# Patient Record
Sex: Male | Born: 1965 | Race: White | Hispanic: No | Smoking: Former smoker
Health system: Southern US, Community
[De-identification: ages and names within clinical notes are randomized; demographics above are authoritative.]

## PROBLEM LIST (undated history)

## (undated) HISTORY — PX: ANKLE SURGERY: SHX546

## (undated) HISTORY — PX: SKIN GRAFT: SHX250

---

## 2001-07-04 ENCOUNTER — Encounter: Payer: Self-pay | Admitting: Emergency Medicine

## 2001-07-04 ENCOUNTER — Emergency Department (HOSPITAL_COMMUNITY): Admission: EM | Admit: 2001-07-04 | Discharge: 2001-07-04 | Payer: Self-pay | Admitting: Emergency Medicine

## 2011-02-25 ENCOUNTER — Other Ambulatory Visit (HOSPITAL_COMMUNITY): Payer: Self-pay | Admitting: Family Medicine

## 2011-02-25 ENCOUNTER — Ambulatory Visit (HOSPITAL_COMMUNITY)
Admission: RE | Admit: 2011-02-25 | Discharge: 2011-02-25 | Disposition: A | Discharge status: Home / Self Care | Payer: Medicaid Other | Source: Ambulatory Visit | Attending: Family Medicine | Admitting: Family Medicine

## 2011-02-25 DIAGNOSIS — M1990 Unspecified osteoarthritis, unspecified site: Secondary | ICD-10-CM

## 2011-02-25 DIAGNOSIS — M79673 Pain in unspecified foot: Secondary | ICD-10-CM

## 2011-02-25 DIAGNOSIS — M25569 Pain in unspecified knee: Secondary | ICD-10-CM | POA: Insufficient documentation

## 2011-02-25 DIAGNOSIS — M25579 Pain in unspecified ankle and joints of unspecified foot: Secondary | ICD-10-CM | POA: Insufficient documentation

## 2011-04-25 ENCOUNTER — Ambulatory Visit (HOSPITAL_COMMUNITY)
Admission: RE | Admit: 2011-04-25 | Discharge: 2011-04-25 | Disposition: A | Discharge status: Home / Self Care | Payer: Medicaid Other | Source: Ambulatory Visit | Attending: Family Medicine | Admitting: Family Medicine

## 2011-04-25 ENCOUNTER — Other Ambulatory Visit (HOSPITAL_COMMUNITY): Payer: Self-pay | Admitting: Family Medicine

## 2011-04-25 DIAGNOSIS — R0609 Other forms of dyspnea: Secondary | ICD-10-CM | POA: Insufficient documentation

## 2011-04-25 DIAGNOSIS — R0602 Shortness of breath: Secondary | ICD-10-CM

## 2011-04-25 DIAGNOSIS — R0989 Other specified symptoms and signs involving the circulatory and respiratory systems: Secondary | ICD-10-CM | POA: Insufficient documentation

## 2012-04-03 ENCOUNTER — Other Ambulatory Visit (HOSPITAL_COMMUNITY): Payer: Self-pay | Admitting: Family Medicine

## 2012-04-03 DIAGNOSIS — M545 Low back pain: Secondary | ICD-10-CM

## 2012-04-13 ENCOUNTER — Ambulatory Visit (HOSPITAL_COMMUNITY): Admission: RE | Admit: 2012-04-13 | Payer: Medicaid Other | Source: Ambulatory Visit

## 2012-04-17 ENCOUNTER — Ambulatory Visit (HOSPITAL_COMMUNITY)
Admission: RE | Admit: 2012-04-17 | Discharge: 2012-04-17 | Disposition: A | Discharge status: Home / Self Care | Payer: Medicaid Other | Source: Ambulatory Visit | Attending: Family Medicine | Admitting: Family Medicine

## 2012-04-17 DIAGNOSIS — M545 Low back pain, unspecified: Secondary | ICD-10-CM | POA: Insufficient documentation

## 2012-04-17 DIAGNOSIS — M5126 Other intervertebral disc displacement, lumbar region: Secondary | ICD-10-CM | POA: Insufficient documentation

## 2017-06-21 ENCOUNTER — Ambulatory Visit (HOSPITAL_COMMUNITY)
Admission: RE | Admit: 2017-06-21 | Discharge: 2017-06-21 | Disposition: A | Discharge status: Home / Self Care | Payer: Medicaid Other | Source: Ambulatory Visit | Attending: Family Medicine | Admitting: Family Medicine

## 2017-06-21 ENCOUNTER — Other Ambulatory Visit (HOSPITAL_COMMUNITY): Payer: Self-pay | Admitting: Family Medicine

## 2017-06-21 DIAGNOSIS — M24871 Other specific joint derangements of right ankle, not elsewhere classified: Secondary | ICD-10-CM | POA: Diagnosis not present

## 2017-06-21 DIAGNOSIS — M438X6 Other specified deforming dorsopathies, lumbar region: Secondary | ICD-10-CM | POA: Diagnosis not present

## 2017-06-21 DIAGNOSIS — M47896 Other spondylosis, lumbar region: Secondary | ICD-10-CM | POA: Diagnosis not present

## 2017-06-21 DIAGNOSIS — X58XXXA Exposure to other specified factors, initial encounter: Secondary | ICD-10-CM | POA: Insufficient documentation

## 2017-06-21 DIAGNOSIS — S99911A Unspecified injury of right ankle, initial encounter: Secondary | ICD-10-CM | POA: Insufficient documentation

## 2017-06-21 DIAGNOSIS — T148XXA Other injury of unspecified body region, initial encounter: Secondary | ICD-10-CM

## 2017-06-21 DIAGNOSIS — R52 Pain, unspecified: Secondary | ICD-10-CM

## 2017-12-13 ENCOUNTER — Other Ambulatory Visit (HOSPITAL_COMMUNITY): Payer: Self-pay | Admitting: Family Medicine

## 2017-12-13 ENCOUNTER — Ambulatory Visit (HOSPITAL_COMMUNITY)
Admission: RE | Admit: 2017-12-13 | Discharge: 2017-12-13 | Disposition: A | Discharge status: Home / Self Care | Payer: Medicaid Other | Source: Ambulatory Visit | Attending: Family Medicine | Admitting: Family Medicine

## 2017-12-13 DIAGNOSIS — M79672 Pain in left foot: Secondary | ICD-10-CM

## 2017-12-13 DIAGNOSIS — M7989 Other specified soft tissue disorders: Secondary | ICD-10-CM | POA: Diagnosis not present

## 2017-12-13 DIAGNOSIS — M25572 Pain in left ankle and joints of left foot: Secondary | ICD-10-CM | POA: Insufficient documentation

## 2020-07-24 ENCOUNTER — Emergency Department (HOSPITAL_COMMUNITY)
Admission: EM | Admit: 2020-07-24 | Discharge: 2020-07-24 | Disposition: A | Discharge status: Home / Self Care | Payer: Medicaid Other | Attending: Emergency Medicine | Admitting: Emergency Medicine

## 2020-07-24 ENCOUNTER — Encounter (HOSPITAL_COMMUNITY): Payer: Self-pay | Admitting: Physician Assistant

## 2020-07-24 ENCOUNTER — Other Ambulatory Visit: Payer: Self-pay

## 2020-07-24 DIAGNOSIS — B0231 Zoster conjunctivitis: Secondary | ICD-10-CM | POA: Diagnosis not present

## 2020-07-24 DIAGNOSIS — Z23 Encounter for immunization: Secondary | ICD-10-CM | POA: Insufficient documentation

## 2020-07-24 DIAGNOSIS — H5711 Ocular pain, right eye: Secondary | ICD-10-CM | POA: Diagnosis present

## 2020-07-24 MED ORDER — VALACYCLOVIR HCL 1 G PO TABS: 1000.0000 mg | ORAL_TABLET | Freq: Three times a day (TID) | ORAL | 0 refills | Status: AC

## 2020-07-24 MED ORDER — GANCICLOVIR 0.15 % OP GEL
1.0000 [drp] | Freq: Once | OPHTHALMIC | Status: AC
  Administered 2020-07-24: 1 [drp] via OPHTHALMIC
  Filled 2020-07-24: qty 5

## 2020-07-24 MED ORDER — TETANUS-DIPHTH-ACELL PERTUSSIS 5-2.5-18.5 LF-MCG/0.5 IM SUSY
0.5000 mL | PREFILLED_SYRINGE | Freq: Once | INTRAMUSCULAR | Status: AC
  Administered 2020-07-24: 0.5 mL via INTRAMUSCULAR
  Filled 2020-07-24: qty 0.5

## 2020-07-24 MED ORDER — TRIFLURIDINE 1 % OP SOLN: 1.0000 [drp] | Freq: Every day | OPHTHALMIC | Status: DC

## 2020-07-24 MED ORDER — FLUORESCEIN SODIUM 1 MG OP STRP
1.0000 | ORAL_STRIP | Freq: Once | OPHTHALMIC | Status: AC
  Administered 2020-07-24: 1 via OPHTHALMIC
  Filled 2020-07-24: qty 1

## 2020-07-24 MED ORDER — TETRACAINE HCL 0.5 % OP SOLN
2.0000 [drp] | Freq: Once | OPHTHALMIC | Status: AC
  Administered 2020-07-24: 2 [drp] via OPHTHALMIC
  Filled 2020-07-24: qty 4

## 2020-07-24 NOTE — ED Provider Notes (Signed)
[facility] EMERGENCY DEPARTMENT Provider Note   CSN: [id number] Arrival date & time: 07/24/20  1257     History Chief Complaint  Patient presents with  . Eye Problem    Marco Russo is a 55 y.o. male who presents today for evaluation of pain in his right eye. He reports that about 4 days ago he started having pain in right side of his head primarily along the right superior nasal bridge and right forehead.  He states that he then began developing bumps and redness in the area.  He developed eye pain yesterday worsening into today.  He denies any known sick contacts.  He does note that he was outside pulling weeds.  He feels like he did have bugs fly into that area and rubbed his eye.  He denies any fevers.  He feels like his pain and symptoms are getting worse.  He is unsure when his last tetanus shot was. He does not have an ophthalmologist  HPI     History reviewed. No pertinent past medical history.  There are no problems to display for this patient.   History reviewed. No pertinent surgical history.     History reviewed. No pertinent family history.     Home Medications Prior to Admission medications   Medication Sig Start Date End Date Taking? Authorizing Provider  valACYclovir (VALTREX) 1000 MG tablet Take 1 tablet (1,000 mg total) by mouth 3 (three) times daily for 7 days. 07/24/20 07/31/20 Yes Cristina Gong, PA-C    Allergies    Patient has no known allergies.  Review of Systems   Review of Systems  Constitutional: Negative for chills and fever.  Eyes: Positive for photophobia, pain, redness and visual disturbance. Negative for discharge and itching.  Respiratory: Negative for shortness of breath.   Cardiovascular: Negative for chest pain.  Gastrointestinal: Negative for abdominal pain.  Neurological: Positive for headaches. Negative for weakness and light-headedness.  All other systems reviewed and are negative.   Physical Exam Updated Vital  Signs BP (!) 153/85 (BP Location: Right Arm)   Pulse (!) 58   Temp 98.6 F (37 C) (Oral)   Resp 16   Ht 5\' 7"  (1.702 m)   Wt 118.8 kg   SpO2 100%   BMI 41.04 kg/m   Physical Exam Vitals and nursing note reviewed.  Constitutional:      General: He is not in acute distress.    Appearance: He is not diaphoretic.  HENT:     Head: Normocephalic and atraumatic.     Comments: See clinical images.  No rash present right ear/TM.  Eyes:     General: No scleral icterus.       Right eye: No foreign body or discharge.        Left eye: No discharge.     Conjunctiva/sclera: Conjunctivae normal.     Comments: Please see clinical image.  There are vesicular rashes present on the right sided forehead, and lateral nasal bridge. There is dendritic staining pattern in the right eye with fluorescein uptake.   Cardiovascular:     Rate and Rhythm: Normal rate and regular rhythm.  Pulmonary:     Effort: Pulmonary effort is normal. No respiratory distress.     Breath sounds: No stridor.  Abdominal:     General: There is no distension.  Musculoskeletal:        General: No deformity.     Cervical back: Normal range of motion and neck supple.  Skin:  General: Skin is warm and dry.  Neurological:     Mental Status: He is alert.     Motor: No abnormal muscle tone.  Psychiatric:        Behavior: Behavior normal.       ED Results / Procedures / Treatments   Labs (all labs ordered are listed, but only abnormal results are displayed) Labs Reviewed - No data to display  EKG None  Radiology No results found.  Procedures Procedures   Medications Ordered in ED Medications  tetracaine (PONTOCAINE) 0.5 % ophthalmic solution 2 drop (2 drops Both Eyes Given 07/24/20 1640)  fluorescein ophthalmic strip 1 strip (1 strip Both Eyes Given 07/24/20 1642)  Tdap (BOOSTRIX) injection 0.5 mL (0.5 mLs Intramuscular Given 07/24/20 1641)  Ganciclovir (ZIRGAN) 0.15 % ophthalmic gel 1 drop (1 drop Right Eye  Given 07/24/20 1707)    ED Course  I have reviewed the triage vital signs and the nursing notes.  Pertinent labs & imaging results that were available during my care of the patient were reviewed by me and considered in my medical decision making (see chart for details).  Clinical Course as of 07/24/20 1820  Fri Jul 24, 2020  1546 I had ordered Viroptic eye drops after talking with dr. Genia Del.  I was informed by pharmacy that we don't have this.   [EH]    Clinical Course User Index [EH] Marco Russo   MDM Rules/Calculators/A&P                           Patient is a 55 year old man who presents today for evaluation of worsening rash around his right eye with worsening eye pain. Clinically rash appears vesicular and is concerning for herpes zoster.  He had a prodrome of pain and it does not cross midline. His eye pain improved significantly with tetracaine eyedrops. He had dendritic appearing fluorescein uptake in the right eye on exam.  No foreign bodies visualized. I spoke with Dr. Genia Del, on-call for ophthalmology.  He will see patient Monday in follow-up.  Patient's tetanus is updated.  Dr. Lucretia Field requested antiviral eyedrops are not available here, therefore he is given alternative ganciclovir eyedrops after I discussed suitable alternatives with pharmacy.  Additionally he is given prescription for Valtrex.  [facility] [place] PMP is consulted, patient is on chronic opioid treatment at baseline therefore he is not given a prescription for additional opioids to go home with.  Return precautions were discussed with patient who states their understanding.  At the time of discharge patient denied any unaddressed complaints or concerns.  Patient is agreeable for discharge home.  Note: Portions of this report may have been transcribed using voice recognition software. Every effort was made to ensure accuracy; however, inadvertent computerized transcription errors may be  present  Final Clinical Impression(s) / ED Diagnoses Final diagnoses:  Herpes zoster conjunctivitis    Rx / DC Orders ED Discharge Orders         Ordered    valACYclovir (VALTREX) 1000 MG tablet  3 times daily        07/24/20 1654           Marco Russo 07/24/20 Sharyne Peach, MD 07/25/20 1447

## 2020-07-24 NOTE — Discharge Instructions (Addendum)
Given that you are on chronic pain medications I didn't give you a prescription for additional pain meds.  Please place one drop in your right eye 5 times a day for 7 days.    Please pick up your medicine from the pharmacy. If your symptoms worsen, you have any concerns please seek additional medical care and evaluation. Please keep your follow-up appointment with the eye doctor.  Today you were seen and evaluated for a rash on your face and pain in your eye.  This appears to be

## 2020-07-24 NOTE — ED Triage Notes (Signed)
Rash around right eye. States this occurred while mowing grass yesterday

## 2020-11-25 ENCOUNTER — Other Ambulatory Visit: Payer: Self-pay

## 2020-11-25 DIAGNOSIS — R10812 Left upper quadrant abdominal tenderness: Secondary | ICD-10-CM | POA: Diagnosis not present

## 2020-11-25 DIAGNOSIS — R10814 Left lower quadrant abdominal tenderness: Secondary | ICD-10-CM | POA: Insufficient documentation

## 2020-11-25 DIAGNOSIS — R112 Nausea with vomiting, unspecified: Secondary | ICD-10-CM | POA: Diagnosis not present

## 2020-11-25 DIAGNOSIS — R197 Diarrhea, unspecified: Secondary | ICD-10-CM | POA: Diagnosis not present

## 2020-11-26 ENCOUNTER — Emergency Department (HOSPITAL_COMMUNITY): Payer: Medicaid Other

## 2020-11-26 ENCOUNTER — Encounter (HOSPITAL_COMMUNITY): Payer: Self-pay | Admitting: Emergency Medicine

## 2020-11-26 ENCOUNTER — Emergency Department (HOSPITAL_COMMUNITY)
Admission: EM | Admit: 2020-11-26 | Discharge: 2020-11-26 | Disposition: A | Discharge status: Home / Self Care | Payer: Medicaid Other | Attending: Emergency Medicine | Admitting: Emergency Medicine

## 2020-11-26 DIAGNOSIS — R197 Diarrhea, unspecified: Secondary | ICD-10-CM

## 2020-11-26 LAB — CBC
HCT: 44.9 % (ref 39.0–52.0)
Hemoglobin: 15.8 g/dL (ref 13.0–17.0)
MCH: 30.7 pg (ref 26.0–34.0)
MCHC: 35.2 g/dL (ref 30.0–36.0)
MCV: 87.2 fL (ref 80.0–100.0)
Platelets: 246 10*3/uL (ref 150–400)
RBC: 5.15 MIL/uL (ref 4.22–5.81)
RDW: 12.6 % (ref 11.5–15.5)
WBC: 10.3 10*3/uL (ref 4.0–10.5)
nRBC: 0 % (ref 0.0–0.2)

## 2020-11-26 LAB — COMPREHENSIVE METABOLIC PANEL
ALT: 41 U/L (ref 0–44)
AST: 30 U/L (ref 15–41)
Albumin: 3.9 g/dL (ref 3.5–5.0)
Alkaline Phosphatase: 71 U/L (ref 38–126)
Anion gap: 3 — ABNORMAL LOW (ref 5–15)
BUN: 15 mg/dL (ref 6–20)
CO2: 21 mmol/L — ABNORMAL LOW (ref 22–32)
Calcium: 8.1 mg/dL — ABNORMAL LOW (ref 8.9–10.3)
Chloride: 109 mmol/L (ref 98–111)
Creatinine, Ser: 0.81 mg/dL (ref 0.61–1.24)
GFR, Estimated: >60 mL/min (ref >60)
Glucose, Bld: 106 mg/dL — ABNORMAL HIGH (ref 70–99)
Potassium: 3.4 mmol/L — ABNORMAL LOW (ref 3.5–5.1)
Sodium: 133 mmol/L — ABNORMAL LOW (ref 135–145)
Total Bilirubin: 0.9 mg/dL (ref 0.3–1.2)
Total Protein: 6.6 g/dL (ref 6.5–8.1)

## 2020-11-26 LAB — LIPASE, BLOOD: Lipase: 25 U/L (ref 11–51)

## 2020-11-26 MED ORDER — IOHEXOL 350 MG/ML SOLN
80.0000 mL | Freq: Once | INTRAVENOUS | Status: AC | PRN
  Administered 2020-11-26: 80 mL via INTRAVENOUS

## 2020-11-26 MED ORDER — SODIUM CHLORIDE 0.9 % IV BOLUS
1000.0000 mL | Freq: Once | INTRAVENOUS | Status: AC
  Administered 2020-11-26: 1000 mL via INTRAVENOUS

## 2020-11-26 MED ORDER — METRONIDAZOLE 500 MG PO TABS
500.0000 mg | ORAL_TABLET | Freq: Once | ORAL | Status: AC
  Administered 2020-11-26: 500 mg via ORAL
  Filled 2020-11-26: qty 1

## 2020-11-26 MED ORDER — METRONIDAZOLE 500 MG PO TABS: 500.0000 mg | ORAL_TABLET | Freq: Three times a day (TID) | ORAL | 0 refills | Status: AC

## 2020-11-26 NOTE — Discharge Instructions (Addendum)
Begin taking Flagyl as prescribed.  Continue the BRAT diet for the next 24 hours, then slowly advance as tolerated.  Return to the emergency department if you develop severe abdominal pain, high fever, bloody stools, or other new and concerning symptoms.

## 2020-11-26 NOTE — ED Triage Notes (Signed)
Pt c/o N/V/D for the past week. Pt states he has been trying to eat a bland diet but hasn't got any better.

## 2020-11-26 NOTE — ED Provider Notes (Signed)
[facility] EMERGENCY DEPARTMENT Provider Note   CSN: [id number] Arrival date & time: 11/25/20  2351     History Chief Complaint  Patient presents with   Emesis    Marco Russo is a 55 y.o. male.  Patient is a 55 year old male with no significant past medical history.  Patient presenting today for evaluation of nausea, vomiting, and diarrhea.  This is been ongoing for approximately 10 days since returning from a vacation to the beach.  He reports eating seafood, but denies eating any undercooked or suspicious foods.  He denies any fevers or chills.  He does describe some left-sided abdominal discomfort.  He denies any bloody stool or vomit.  He has tried eating a brat diet, but symptoms are not improving.  The history is provided by the patient.  Emesis Severity:  Moderate Duration:  10 days Timing:  Constant Quality:  Stomach contents Progression:  Worsening Chronicity:  New Relieved by:  Nothing Worsened by:  Nothing     History reviewed. No pertinent past medical history.  There are no problems to display for this patient.   History reviewed. No pertinent surgical history.     History reviewed. No pertinent family history.     Home Medications Prior to Admission medications   Not on File    Allergies    Patient has no known allergies.  Review of Systems   Review of Systems  Gastrointestinal:  Positive for vomiting.  All other systems reviewed and are negative.  Physical Exam Updated Vital Signs BP 131/81   Pulse 81   Resp 14   Ht 5\' 7"  (1.702 m)   Wt 117.9 kg   SpO2 95%   BMI 40.72 kg/m   Physical Exam Vitals and nursing note reviewed.  Constitutional:      General: He is not in acute distress.    Appearance: He is well-developed. He is not diaphoretic.  HENT:     Head: Normocephalic and atraumatic.  Cardiovascular:     Rate and Rhythm: Normal rate and regular rhythm.     Heart sounds: No murmur heard.   No friction rub.   Pulmonary:     Effort: Pulmonary effort is normal. No respiratory distress.     Breath sounds: Normal breath sounds. No wheezing or rales.  Abdominal:     General: Bowel sounds are normal. There is no distension.     Palpations: Abdomen is soft.     Tenderness: There is abdominal tenderness. There is no guarding or rebound.     Hernia: No hernia is present.     Comments: There is mild tenderness to palpation to the left upper and left lower quadrants.  Musculoskeletal:        General: Normal range of motion.     Cervical back: Normal range of motion and neck supple.  Skin:    General: Skin is warm and dry.  Neurological:     Mental Status: He is alert and oriented to person, place, and time.     Coordination: Coordination normal.    ED Results / Procedures / Treatments   Labs (all labs ordered are listed, but only abnormal results are displayed) Labs Reviewed  COMPREHENSIVE METABOLIC PANEL - Abnormal; Notable for the following components:      Result Value   Sodium 133 (*)    Potassium 3.4 (*)    CO2 21 (*)    Glucose, Bld 106 (*)    Calcium 8.1 (*)  Anion gap 3 (*)    All other components within normal limits  C DIFFICILE QUICK SCREEN W PCR REFLEX    GASTROINTESTINAL PANEL BY PCR, STOOL (REPLACES STOOL CULTURE)  LIPASE, BLOOD  CBC  URINALYSIS, ROUTINE W REFLEX MICROSCOPIC    EKG None  Radiology No results found.  Procedures Procedures   Medications Ordered in ED Medications  sodium chloride 0.9 % bolus 1,000 mL (has no administration in time range)    ED Course  I have reviewed the triage vital signs and the nursing notes.  Pertinent labs & imaging results that were available during my care of the patient were reviewed by me and considered in my medical decision making (see chart for details).    MDM Rules/Calculators/A&P  Patient presenting with complaints of diarrhea that has been persistent for the past 10 days.  This started upon returning from  his vacation to the beach.  I suspect some sort of infectious source due to the duration of illness.  Patient's work-up today is unremarkable including laboratory studies and CT scan.  Patient hydrated and seems to be feeling better.  I will prescribe Flagyl due to the prolonged nature of his illness.  Final Clinical Impression(s) / ED Diagnoses Final diagnoses:  None    Rx / DC Orders ED Discharge Orders     None        Geoffery Lyons, MD 11/26/20 [phone]

## 2020-11-27 ENCOUNTER — Encounter (HOSPITAL_COMMUNITY): Payer: Self-pay

## 2020-11-27 ENCOUNTER — Emergency Department (HOSPITAL_COMMUNITY)
Admission: EM | Admit: 2020-11-27 | Discharge: 2020-11-27 | Disposition: A | Discharge status: Home / Self Care | Payer: Medicaid Other | Attending: Emergency Medicine | Admitting: Emergency Medicine

## 2020-11-27 ENCOUNTER — Other Ambulatory Visit: Payer: Self-pay

## 2020-11-27 DIAGNOSIS — R197 Diarrhea, unspecified: Secondary | ICD-10-CM | POA: Diagnosis present

## 2020-11-27 DIAGNOSIS — R1110 Vomiting, unspecified: Secondary | ICD-10-CM | POA: Diagnosis not present

## 2020-11-27 DIAGNOSIS — Z87891 Personal history of nicotine dependence: Secondary | ICD-10-CM | POA: Insufficient documentation

## 2020-11-27 LAB — CBC
HCT: 43.1 % (ref 39.0–52.0)
Hemoglobin: 15.5 g/dL (ref 13.0–17.0)
MCH: 30.9 pg (ref 26.0–34.0)
MCHC: 36 g/dL (ref 30.0–36.0)
MCV: 85.9 fL (ref 80.0–100.0)
Platelets: 249 10*3/uL (ref 150–400)
RBC: 5.02 MIL/uL (ref 4.22–5.81)
RDW: 12.7 % (ref 11.5–15.5)
WBC: 10.2 10*3/uL (ref 4.0–10.5)
nRBC: 0 % (ref 0.0–0.2)

## 2020-11-27 LAB — URINALYSIS, MICROSCOPIC (REFLEX): Bacteria, UA: NONE SEEN

## 2020-11-27 LAB — COMPREHENSIVE METABOLIC PANEL
ALT: 64 U/L — ABNORMAL HIGH (ref 0–44)
AST: 43 U/L — ABNORMAL HIGH (ref 15–41)
Albumin: 3.8 g/dL (ref 3.5–5.0)
Alkaline Phosphatase: 70 U/L (ref 38–126)
Anion gap: 7 (ref 5–15)
BUN: 10 mg/dL (ref 6–20)
CO2: 19 mmol/L — ABNORMAL LOW (ref 22–32)
Calcium: 8.4 mg/dL — ABNORMAL LOW (ref 8.9–10.3)
Chloride: 109 mmol/L (ref 98–111)
Creatinine, Ser: 0.89 mg/dL (ref 0.61–1.24)
GFR, Estimated: >60 mL/min (ref >60)
Glucose, Bld: 99 mg/dL (ref 70–99)
Potassium: 3.5 mmol/L (ref 3.5–5.1)
Sodium: 135 mmol/L (ref 135–145)
Total Bilirubin: 0.9 mg/dL (ref 0.3–1.2)
Total Protein: 6.4 g/dL — ABNORMAL LOW (ref 6.5–8.1)

## 2020-11-27 LAB — URINALYSIS, ROUTINE W REFLEX MICROSCOPIC
Bilirubin Urine: NEGATIVE
Glucose, UA: NEGATIVE mg/dL
Hgb urine dipstick: NEGATIVE
Ketones, ur: NEGATIVE mg/dL
Nitrite: NEGATIVE
Protein, ur: NEGATIVE mg/dL
Specific Gravity, Urine: 1.025 (ref 1.005–1.030)
pH: 6 (ref 5.0–8.0)

## 2020-11-27 LAB — LIPASE, BLOOD: Lipase: 22 U/L (ref 11–51)

## 2020-11-27 LAB — C DIFFICILE QUICK SCREEN W PCR REFLEX
C Diff antigen: NEGATIVE
C Diff interpretation: NOT DETECTED
C Diff toxin: NEGATIVE

## 2020-11-27 MED ORDER — LOPERAMIDE HCL 2 MG PO CAPS
4.0000 mg | ORAL_CAPSULE | Freq: Once | ORAL | Status: AC
  Administered 2020-11-27: 4 mg via ORAL
  Filled 2020-11-27: qty 2

## 2020-11-27 MED ORDER — SODIUM CHLORIDE 0.9 % IV BOLUS
1000.0000 mL | Freq: Once | INTRAVENOUS | Status: AC
  Administered 2020-11-27: 1000 mL via INTRAVENOUS

## 2020-11-27 MED ORDER — LACTATED RINGERS IV BOLUS
1000.0000 mL | Freq: Once | INTRAVENOUS | Status: AC
  Administered 2020-11-27: 1000 mL via INTRAVENOUS

## 2020-11-27 MED ORDER — ONDANSETRON HCL 4 MG/2ML IJ SOLN
4.0000 mg | Freq: Once | INTRAMUSCULAR | Status: AC
  Administered 2020-11-27: 4 mg via INTRAVENOUS
  Filled 2020-11-27: qty 2

## 2020-11-27 NOTE — ED Provider Notes (Signed)
[facility] EMERGENCY DEPARTMENT Provider Note   CSN: [id number] Arrival date & time: 11/27/20  1101     History Chief Complaint  Patient presents with  . Diarrhea    Marco Russo is a 55 y.o. male.  Patient presents ER chief complaint of diarrhea.  He states this started after he returned from a beach vacation approximately 2 weeks ago.  He states he had vomiting initially but that is improved.  He had persistent multiple episodes of watery diarrhea over the course the last 2 weeks.  He was seen and here in ER about 2 days ago given a prescription of Flagyl which he states has not improved his symptoms.  Denies any fevers or cough.  Denies any abdominal pain.      History reviewed. No pertinent past medical history.  There are no problems to display for this patient.   Past Surgical History:  Procedure Laterality Date  . ANKLE SURGERY    . SKIN GRAFT         No family history on file.  Social History   Tobacco Use  . Smoking status: Former    Types: Cigarettes  . Smokeless tobacco: Never  Substance Use Topics  . Alcohol use: Not Currently  . Drug use: Never    Home Medications Prior to Admission medications   Medication Sig Start Date End Date Taking? Authorizing Provider  acetaminophen (TYLENOL) 325 MG tablet Take 650 mg by mouth every 6 (six) hours as needed.   Yes [provider]  metroNIDAZOLE (FLAGYL) 500 MG tablet Take 1 tablet (500 mg total) by mouth 3 (three) times daily. One po tid x 7 days 11/26/20  Yes Delo, Riley Lam, MD  QUEtiapine (SEROQUEL) 200 MG tablet Take 200 mg by mouth at bedtime. Patient not taking: No sig reported 10/31/20   [provider]    Allergies    Patient has no known allergies.  Review of Systems   Review of Systems  Constitutional:  Negative for fever.  HENT:  Negative for ear pain and sore throat.   Eyes:  Negative for pain.  Respiratory:  Negative for cough.   Cardiovascular:  Negative for chest  pain.  Gastrointestinal:  Positive for diarrhea. Negative for abdominal pain.  Genitourinary:  Negative for flank pain.  Musculoskeletal:  Negative for back pain.  Skin:  Negative for color change and rash.  Neurological:  Negative for syncope.  All other systems reviewed and are negative.  Physical Exam Updated Vital Signs BP (!) 143/99   Pulse 65   Temp 98.2 F (36.8 C) (Oral)   Resp 16   Ht 5\' 7"  (1.702 m)   Wt 117.9 kg   SpO2 96%   BMI 40.72 kg/m   Physical Exam Constitutional:      Appearance: He is well-developed.  HENT:     Head: Normocephalic.     Nose: Nose normal.  Eyes:     Extraocular Movements: Extraocular movements intact.  Cardiovascular:     Rate and Rhythm: Normal rate.  Pulmonary:     Effort: Pulmonary effort is normal.  Skin:    Coloration: Skin is not jaundiced.  Neurological:     Mental Status: He is alert. Mental status is at baseline.    ED Results / Procedures / Treatments   Labs (all labs ordered are listed, but only abnormal results are displayed) Labs Reviewed  COMPREHENSIVE METABOLIC PANEL - Abnormal; Notable for the following components:      Result  Value   CO2 19 (*)    Calcium 8.4 (*)    Total Protein 6.4 (*)    AST 43 (*)    ALT 64 (*)    All other components within normal limits  URINALYSIS, ROUTINE W REFLEX MICROSCOPIC - Abnormal; Notable for the following components:   Leukocytes,Ua TRACE (*)    All other components within normal limits  C DIFFICILE QUICK SCREEN W PCR REFLEX    GASTROINTESTINAL PANEL BY PCR, STOOL (REPLACES STOOL CULTURE)  LIPASE, BLOOD  CBC  URINALYSIS, MICROSCOPIC (REFLEX)    EKG None  Radiology CT ABDOMEN PELVIS W CONTRAST  Result Date: 11/26/2020 CLINICAL DATA:  Nausea/vomiting/diarrhea EXAM: CT ABDOMEN AND PELVIS WITH CONTRAST TECHNIQUE: Multidetector CT imaging of the abdomen and pelvis was performed using the standard protocol following bolus administration of intravenous contrast. CONTRAST:   63mL OMNIPAQUE IOHEXOL 350 MG/ML SOLN COMPARISON:  None. FINDINGS: Lower chest: Lung bases are clear. Hepatobiliary: Liver is within normal limits. 2.5 cm gallstone (series 2/image 27), without associated inflammatory changes. No intrahepatic or extrahepatic ductal dilatation. Pancreas: Within normal limits. Spleen: Within normal limits. Adrenals/Urinary Tract: Low-density thickening of the bilateral adrenal glands, including a 2.2 cm left adrenal nodule (series 2/image 25), favoring a benign adrenal adenoma. Kidneys are within normal limits.  No hydronephrosis. Bladder is underdistended but unremarkable. Stomach/Bowel: Stomach is within normal limits. No evidence of bowel obstruction. Normal appendix (series 2/image 61). No colonic wall thickening or inflammatory changes. Vascular/Lymphatic: No evidence of abdominal aortic aneurysm. IVC filter. No suspicious abdominopelvic lymphadenopathy. Reproductive: Prostate is unremarkable. Other: No abdominopelvic ascites. Mesenteric calcifications in the left upper abdomen (for example, series 2/image 16), benign. Musculoskeletal: Degenerative changes of the visualized thoracolumbar spine. Mild superior endplate compression fracture deformity at L1 (sagittal image 68), chronic. IMPRESSION: No evidence of bowel obstruction.  Normal appendix. Cholelithiasis, without associated inflammatory changes. 2.2 cm left adrenal adenoma, benign. Low-density thickening of the right adrenal gland, benign. Electronically Signed   By: Charline Bills M.D.   On: 11/26/2020 03:47    Procedures Procedures   Medications Ordered in ED Medications  sodium chloride 0.9 % bolus 1,000 mL (0 mLs Intravenous Stopped 11/27/20 1310)  loperamide (IMODIUM) capsule 4 mg (4 mg Oral Given 11/27/20 1150)  ondansetron (ZOFRAN) injection 4 mg (4 mg Intravenous Given 11/27/20 1218)  lactated ringers bolus 1,000 mL (1,000 mLs Intravenous New Bag/Given 11/27/20 1448)    ED Course  I have reviewed the triage  vital signs and the nursing notes.  Pertinent labs & imaging results that were available during my care of the patient were reviewed by me and considered in my medical decision making (see chart for details).    MDM Rules/Calculators/A&P                           Labs here show mild bicarb dropped to 19 otherwise within normal limits.  Patient given a liter bolus of fluids.  Stool sample sent to the lab but still pending results.  Your abdominal exams with no abdominal tenderness or guarding or rebound.  Patient advised continued Imodium to finish the course of antibiotics and follow-up with his primary care doctor on outpatient basis within the week.  Advising immediate return if he feels he is not getting enough fluid hydration at home or if he has worsening symptoms pain fevers or any additional concerns.  Final Clinical Impression(s) / ED Diagnoses Final diagnoses:  Diarrhea, unspecified type  Rx / DC Orders ED Discharge Orders     None        Cheryll Cockayne, MD 11/27/20 [phone]

## 2020-11-27 NOTE — Discharge Instructions (Addendum)
Continue taking Imodium at home.  Follow-up with your primary care doctor or gastroenterologist within the week.  Return if you have bleeding fevers pain or cannot keep any fluids down.

## 2020-11-27 NOTE — ED Triage Notes (Signed)
Pt presents to ED with continued diarrhea, nausea and dizziness x 1.5 weeks. Pt states he used spectracide at home and that's when his symptoms got worse.

## 2020-11-28 ENCOUNTER — Telehealth (HOSPITAL_COMMUNITY): Payer: Self-pay | Admitting: Physician Assistant

## 2020-11-28 LAB — GASTROINTESTINAL PANEL BY PCR, STOOL (REPLACES STOOL CULTURE)

## 2020-11-28 NOTE — Telephone Encounter (Signed)
10:37 PM Attempted to contact the patient by telephone at the mobile number listed on chart x2 and there was no answer. Voicemail was not set up.

## 2020-11-28 NOTE — ED Provider Notes (Signed)
10:50 AM Pt called back the emergency department. He reports continued diarrhea but states he has been trying to hydrate and follow the BRAT diet as directed. Will give rx for nitazoxanide. This will be be called into his pharmacy.  Have recommended that if he is no better in the next 24-48 hours he will need to be reevaluated    Karrie Meres, PA-C 11/28/20 1058    Vanetta Mulders, MD 11/29/20 [phone]

## 2021-08-05 ENCOUNTER — Encounter (INDEPENDENT_AMBULATORY_CARE_PROVIDER_SITE_OTHER): Payer: Self-pay | Admitting: *Deleted

## 2021-12-21 ENCOUNTER — Encounter: Payer: Self-pay | Admitting: *Deleted

## 2022-01-31 ENCOUNTER — Encounter (INDEPENDENT_AMBULATORY_CARE_PROVIDER_SITE_OTHER): Payer: Self-pay | Admitting: *Deleted

## 2022-04-17 IMAGING — CT CT ABD-PELV W/ CM
2 of 5 series · 16 of 46 positions shown, 18 images · IV contrast (Omnipaque or Isovue)
Comparison: None.

CLINICAL DATA: Nausea/vomiting/diarrhea

EXAM:
CT ABDOMEN AND PELVIS WITH CONTRAST
TECHNIQUE: Multidetector CT imaging of the abdomen and pelvis was performed
using the standard protocol following bolus administration of
intravenous contrast.
CONTRAST:  80mL OMNIPAQUE IOHEXOL 350 MG/ML SOLN

[Series 2: axial st · axial · 0.85mm/px · z∈[+847,+1257]mm · 13 of 94 slices shown, 15 images]
[im 6/94  soft-tissue]
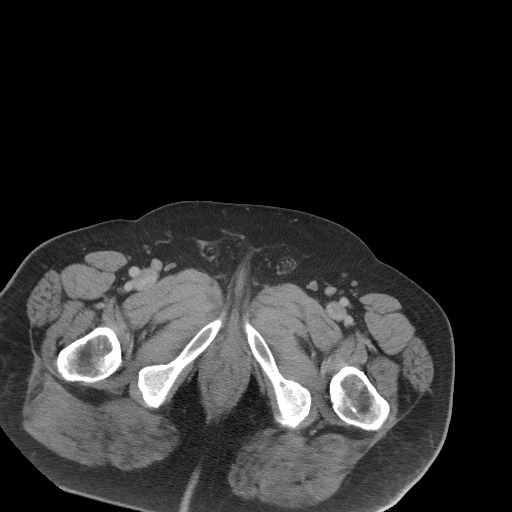
[im 6/94  bone]
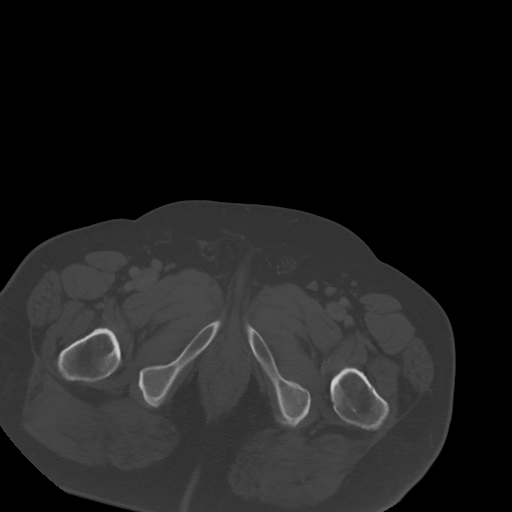
[im 11/94  soft-tissue]
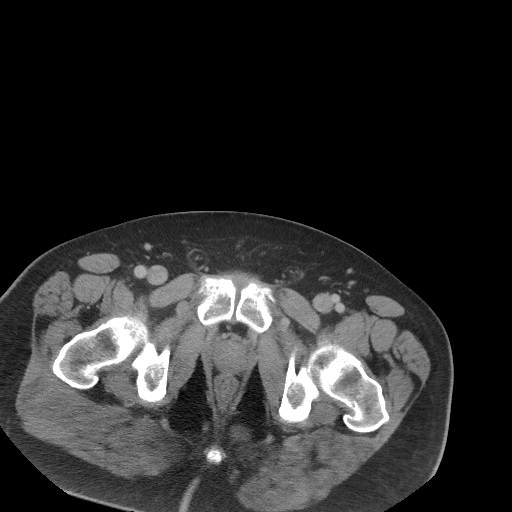
[im 22/94  soft-tissue]
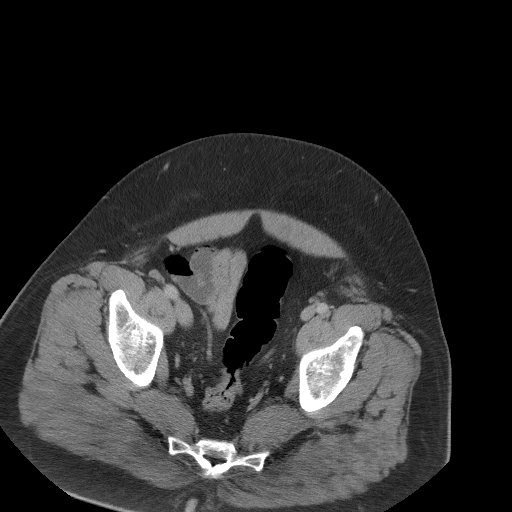
[im 28/94  soft-tissue]
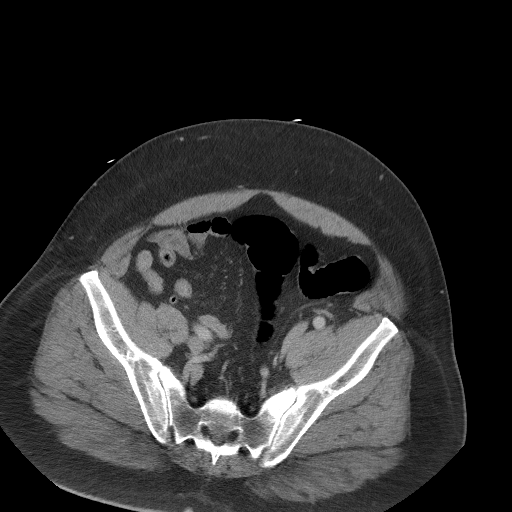
[im 33/94  soft-tissue]
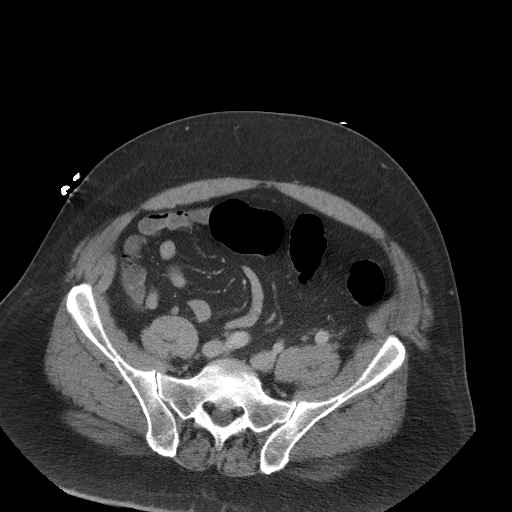
[im 39/94  soft-tissue]
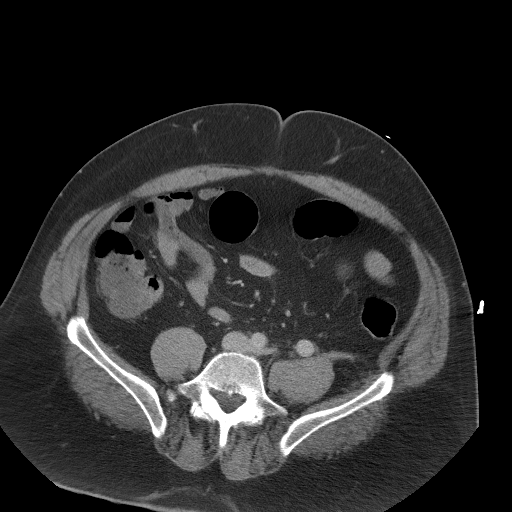
[im 50/94  soft-tissue]
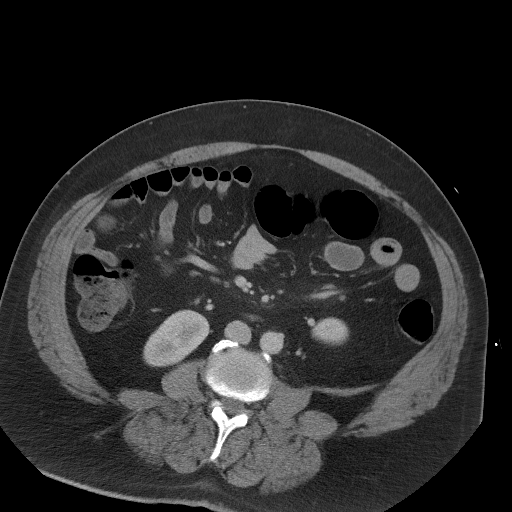
[im 55/94  soft-tissue]
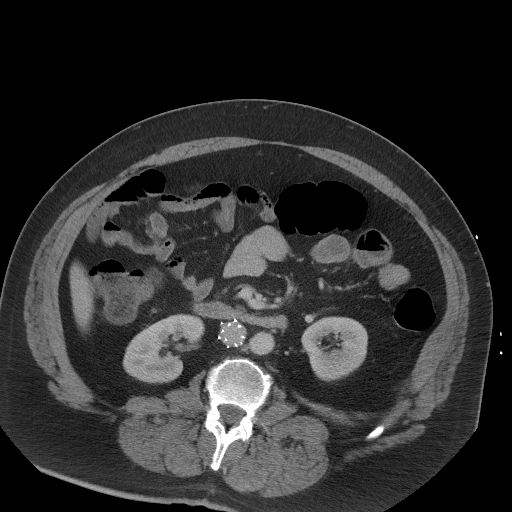
[im 61/94  soft-tissue]
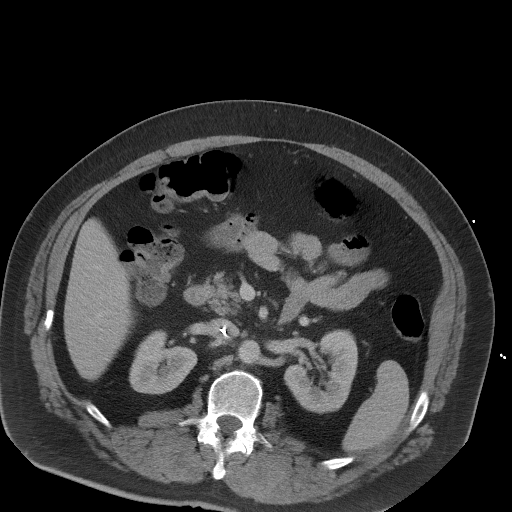
[im 61/94  bone]
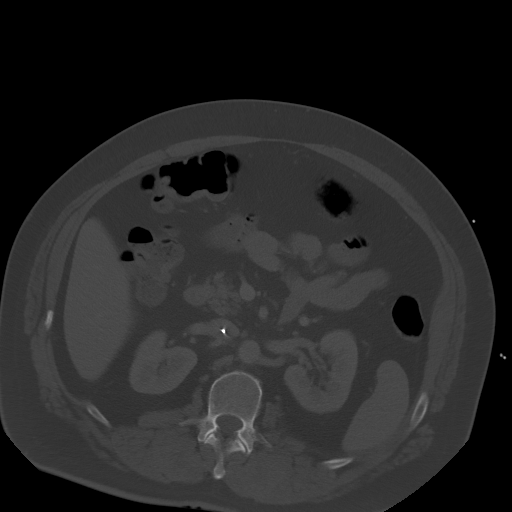
[im 66/94  soft-tissue]
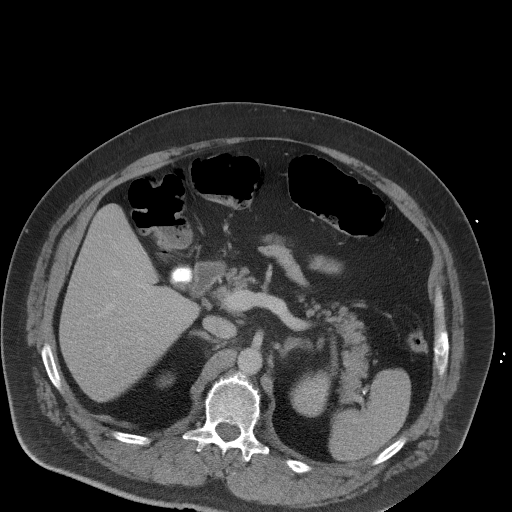
[im 72/94  soft-tissue]
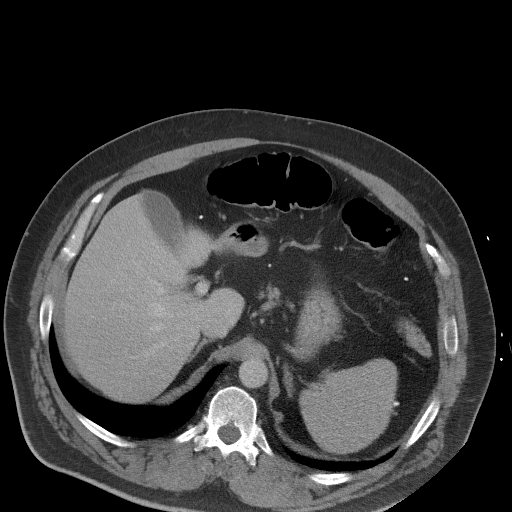
[im 83/94  soft-tissue]
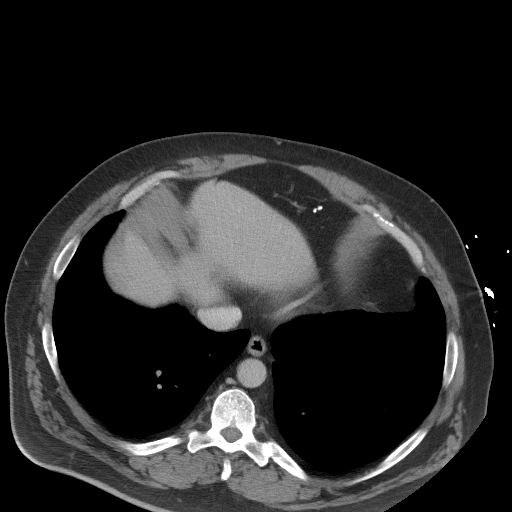
[im 88/94  soft-tissue]
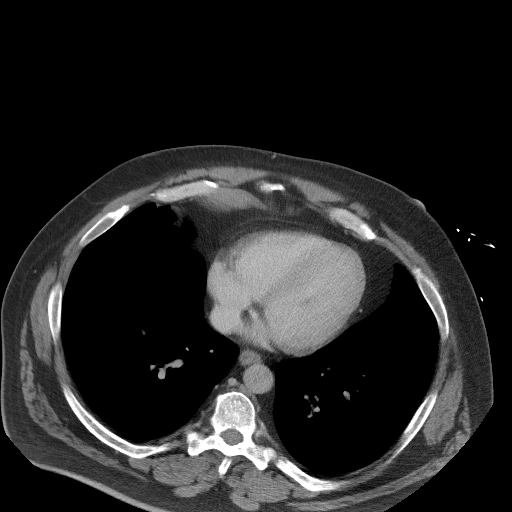

[Series 5: coronal st · coronal · 0.82mm/px · 3 of 117 slices shown]
[im 39/117  soft-tissue]
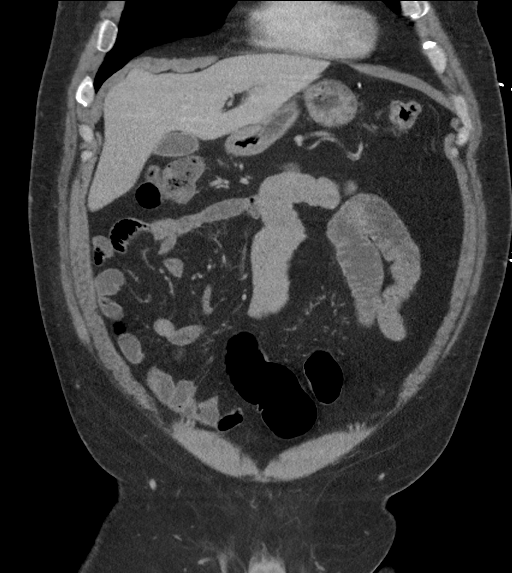
[im 52/117  soft-tissue]
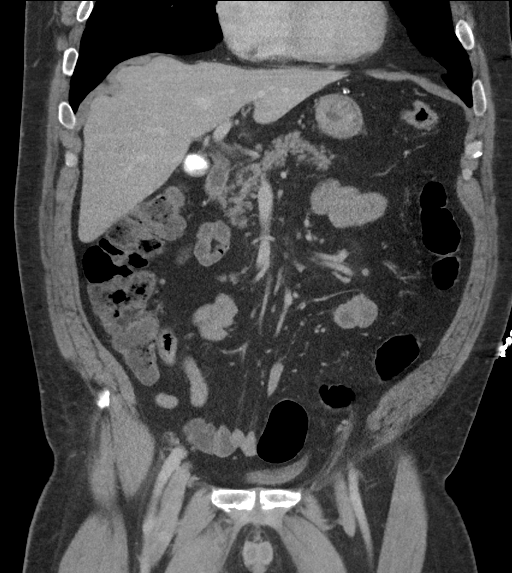
[im 65/117  soft-tissue]
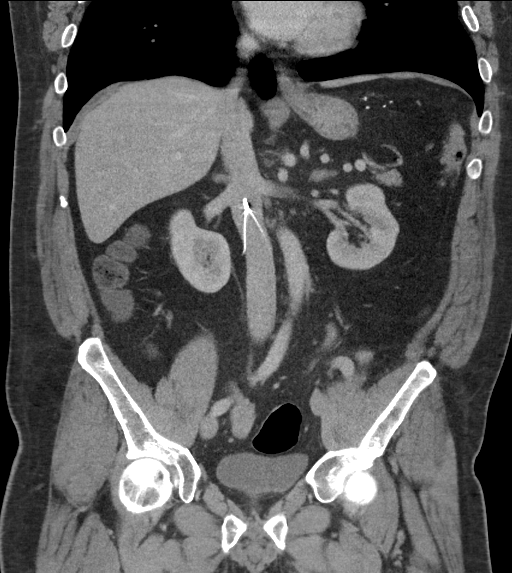

[16 of 46 positions shown; findings below may reference images not displayed]

FINDINGS: Lower chest: Lung bases are clear.

Hepatobiliary: Liver is within normal limits.

2.5 cm gallstone (series 2/image 27), without associated
inflammatory changes. No intrahepatic or extrahepatic ductal
dilatation.

Pancreas: Within normal limits.

Spleen: Within normal limits.

Adrenals/Urinary Tract: Low-density thickening of the bilateral
adrenal glands, including a 2.2 cm left adrenal nodule (series
2/image 25), favoring a benign adrenal adenoma.

Kidneys are within normal limits.  No hydronephrosis.

Bladder is underdistended but unremarkable.

Stomach/Bowel: Stomach is within normal limits.

No evidence of bowel obstruction.

Normal appendix (series 2/image 61).

No colonic wall thickening or inflammatory changes.

Vascular/Lymphatic: No evidence of abdominal aortic aneurysm.

IVC filter.

No suspicious abdominopelvic lymphadenopathy.

Reproductive: Prostate is unremarkable.

Other: No abdominopelvic ascites.

Mesenteric calcifications in the left upper abdomen (for example,
series 2/image 16), benign.

Musculoskeletal: Degenerative changes of the visualized
thoracolumbar spine. Mild superior endplate compression fracture
deformity at L1 (sagittal image 68), chronic.
IMPRESSION: No evidence of bowel obstruction.  Normal appendix.

Cholelithiasis, without associated inflammatory changes.

2.2 cm left adrenal adenoma, benign. Low-density thickening of the
right adrenal gland, benign.

## 2022-05-27 ENCOUNTER — Encounter: Payer: Self-pay | Admitting: *Deleted

## 2023-09-06 ENCOUNTER — Other Ambulatory Visit (HOSPITAL_COMMUNITY): Payer: Self-pay | Admitting: Adult Health

## 2023-09-06 ENCOUNTER — Encounter (HOSPITAL_COMMUNITY): Payer: Self-pay | Admitting: Internal Medicine

## 2023-09-06 DIAGNOSIS — M25561 Pain in right knee: Secondary | ICD-10-CM

## 2023-09-13 ENCOUNTER — Ambulatory Visit (HOSPITAL_COMMUNITY): Admission: RE | Admit: 2023-09-13 | Source: Ambulatory Visit

## 2023-09-14 ENCOUNTER — Ambulatory Visit (HOSPITAL_COMMUNITY)
Admission: RE | Admit: 2023-09-14 | Discharge: 2023-09-14 | Disposition: A | Discharge status: Home / Self Care | Source: Ambulatory Visit | Attending: Adult Health | Admitting: Adult Health

## 2023-09-14 DIAGNOSIS — M25561 Pain in right knee: Secondary | ICD-10-CM | POA: Insufficient documentation
